# Patient Record
Sex: Female | Born: 1940 | Race: Black or African American | Hispanic: No | Marital: Married | Smoking: Never smoker
Health system: Southern US, Community
[De-identification: ages and names within clinical notes are randomized; demographics above are authoritative.]

## PROBLEM LIST (undated history)

## (undated) DIAGNOSIS — I10 Essential (primary) hypertension: Secondary | ICD-10-CM

## (undated) DIAGNOSIS — E039 Hypothyroidism, unspecified: Secondary | ICD-10-CM

## (undated) HISTORY — PX: PARTIAL HYSTERECTOMY: SHX80

## (undated) HISTORY — DX: Hypothyroidism, unspecified: E03.9

## (undated) HISTORY — DX: Essential (primary) hypertension: I10

---

## 1997-12-23 ENCOUNTER — Encounter: Payer: Self-pay | Admitting: Surgery

## 1997-12-24 ENCOUNTER — Ambulatory Visit (HOSPITAL_COMMUNITY): Admission: RE | Admit: 1997-12-24 | Discharge: 1997-12-25 | Payer: Self-pay | Admitting: Surgery

## 2013-03-27 ENCOUNTER — Ambulatory Visit: Payer: Self-pay | Admitting: Cardiology

## 2014-03-13 ENCOUNTER — Encounter: Payer: Self-pay | Admitting: Cardiology

## 2014-03-25 ENCOUNTER — Encounter: Payer: Self-pay | Admitting: *Deleted

## 2014-03-26 ENCOUNTER — Encounter: Payer: Self-pay | Admitting: *Deleted

## 2014-03-26 ENCOUNTER — Ambulatory Visit (INDEPENDENT_AMBULATORY_CARE_PROVIDER_SITE_OTHER): Payer: Medicare Other | Admitting: Cardiology

## 2014-03-26 VITALS — BP 146/97 | HR 109 | Ht 67.0 in | Wt 185.0 lb

## 2014-03-26 DIAGNOSIS — I10 Essential (primary) hypertension: Secondary | ICD-10-CM

## 2014-03-26 DIAGNOSIS — R002 Palpitations: Secondary | ICD-10-CM

## 2014-03-26 DIAGNOSIS — R000 Tachycardia, unspecified: Secondary | ICD-10-CM

## 2014-03-26 NOTE — Patient Instructions (Signed)
Your physician recommends that you schedule a follow-up appointment in: 4 weeks with Dr. Wyline MoodBranch  Your physician recommends that you continue on your current medications as directed. Please refer to the Current Medication list given to you today.  Your physician has recommended that you wear an event monitor for 2 WEEKS. Event monitors are medical devices that record the heart's electrical activity. Doctors most often us these monitors to diagnose arrhythmias. Arrhythmias are problems with the speed or rhythm of the heartbeat. The monitor is a small, portable device. You can wear one while you do your normal daily activities. This is usually used to diagnose what is causing palpitations/syncope (passing out).  Thank you for choosing [facility]!!

## 2014-03-26 NOTE — Progress Notes (Signed)
Clinical Summary [name] is a 74 y.o.female seen today as a new patient for the following medical problems.  1. Palpitations/Tachycardia - notes occasional palpations at times. Occurs often with feeling excited. Does not occur frequently. No other associated symptoms. Resolves with rest. - walks a mile daily without troubles or symptoms. - occas LE edema. No orthopnea. No chest pain  - stopped coffee 2 weeks ago, drinks tea occasionally, no sodas, no energy drinks, no EtoH. 4-5 bottles of water a day.      Past Medical History  Diagnosis Date  . Hypertension   . Hypothyroidism      Allergies  Allergen Reactions  . Benicar [Olmesartan] Other (See Comments)    LIGHTHEADEDNESS  . Carvedilol Other (See Comments)    DIZZINESS,FATIGUE     Current Outpatient Prescriptions  Medication Sig Dispense Refill  . amLODipine (NORVASC) 10 MG tablet Take 10 mg by mouth daily.    Marland Kitchen aspirin 81 MG tablet Take 81 mg by mouth daily.    Marland Kitchen atorvastatin (LIPITOR) 10 MG tablet Take 10 mg by mouth daily.    . benazepril (LOTENSIN) 40 MG tablet Take 40 mg by mouth daily.    . cholecalciferol (VITAMIN D) 1000 UNITS tablet Take 1,000 Units by mouth daily.    . Omega-3 Fatty Acids (FISH OIL) 1000 MG CAPS Take 1,000 mg by mouth.     No current facility-administered medications for this visit.     Past Surgical History  Procedure Laterality Date  . Partial hysterectomy       Allergies  Allergen Reactions  . Benicar [Olmesartan] Other (See Comments)    LIGHTHEADEDNESS  . Carvedilol Other (See Comments)    DIZZINESS,FATIGUE      Family History  Problem Relation Age of Onset  . Diabetes Mother   . Hypotension Father      Social History Claudia Porter reports that she has never smoked. She does not have any smokeless tobacco history on file. Claudia Porter has no alcohol history on file.   Review of Systems CONSTITUTIONAL: No weight loss, fever, chills, weakness or fatigue.    HEENT: Eyes: No visual loss, blurred vision, double vision or yellow sclerae.No hearing loss, sneezing, congestion, runny nose or sore throat.  SKIN: No rash or itching.  CARDIOVASCULAR: per HPI RESPIRATORY: No shortness of breath, cough or sputum.  GASTROINTESTINAL: No anorexia, nausea, vomiting or diarrhea. No abdominal pain or blood.  GENITOURINARY: No burning on urination, no polyuria NEUROLOGICAL: No headache, dizziness, syncope, paralysis, ataxia, numbness or tingling in the extremities. No change in bowel or bladder control.  MUSCULOSKELETAL: No muscle, back pain, joint pain or stiffness.  LYMPHATICS: No enlarged nodes. No history of splenectomy.  PSYCHIATRIC: No history of depression or anxiety.  ENDOCRINOLOGIC: No reports of sweating, cold or heat intolerance. No polyuria or polydipsia.  Marland Kitchen   Physical Examination p 104 bp 146/97 Wt 185 lbs BMI 29 O2 sat 96% Gen: resting comfortably, no acute distress HEENT: no scleral icterus, pupils equal round and reactive, no palptable cervical adenopathy,  CV: RRR, no m/r/g, no JVD Resp: Clear to auscultation bilaterally GI: abdomen is soft, non-tender, non-distended, normal bowel sounds, no hepatosplenomegaly MSK: extremities are warm, no edema.  Skin: warm, no rash Neuro:  no focal deficits Psych: appropriate affect   Diagnostic Studies 02/2013 Echo LVEF 60-65%, abnormal diastolic function,    Assessment and Plan   1. Palpitations/Tachycardia - resting sinus tach on EKG. No hypoxia, no chronic pain,no anemia,  normal thyroid, negative orthostatics don't support dehydration. Limited caffeine intake, no other stimulants. Normal echo.  - will obtain 2 week event monitor.      Antoine PocheJonathan F. Makiya Jeune, M.D.

## 2014-03-27 DIAGNOSIS — R002 Palpitations: Secondary | ICD-10-CM | POA: Insufficient documentation

## 2014-03-28 DIAGNOSIS — R002 Palpitations: Secondary | ICD-10-CM | POA: Diagnosis not present

## 2014-04-16 ENCOUNTER — Telehealth: Payer: Self-pay | Admitting: *Deleted

## 2014-04-16 NOTE — Telephone Encounter (Signed)
EOS event monitor received. To scan and route to Dr. Wyline MoodBranch

## 2014-04-24 ENCOUNTER — Ambulatory Visit (INDEPENDENT_AMBULATORY_CARE_PROVIDER_SITE_OTHER): Payer: Medicare Other | Admitting: Cardiology

## 2014-04-24 ENCOUNTER — Other Ambulatory Visit: Payer: Self-pay | Admitting: *Deleted

## 2014-04-24 ENCOUNTER — Encounter: Payer: Self-pay | Admitting: Cardiology

## 2014-04-24 VITALS — BP 151/94 | HR 101 | Ht 67.0 in | Wt 184.0 lb

## 2014-04-24 DIAGNOSIS — R000 Tachycardia, unspecified: Secondary | ICD-10-CM

## 2014-04-24 DIAGNOSIS — R002 Palpitations: Secondary | ICD-10-CM

## 2014-04-24 NOTE — Patient Instructions (Signed)
Your physician recommends that you schedule a follow-up appointment in: as needed with Dr. Wyline MoodBranch  Your physician recommends that you continue on your current medications as directed. Please refer to the Current Medication list given to you today.  Thank you for choosing [facility]!!

## 2014-04-24 NOTE — Progress Notes (Signed)
     Clinical Summary Ms. Nanna is a 74 y.o.female seen today for follow up of the following medical problems.   1. Palpitations/Tachycardia - since last visit symptoms much improved after cutting back on caffeine - event monitor showed no significant arrhythmias.    Past Medical History  Diagnosis Date  . Hypertension   . Hypothyroidism      Allergies  Allergen Reactions  . Benicar [Olmesartan] Other (See Comments)    LIGHTHEADEDNESS  . Carvedilol Other (See Comments)    DIZZINESS,FATIGUE     Current Outpatient Prescriptions  Medication Sig Dispense Refill  . amLODipine (NORVASC) 10 MG tablet Take 10 mg by mouth daily.    Marland Kitchen. aspirin 81 MG tablet Take 81 mg by mouth daily.    Marland Kitchen. atorvastatin (LIPITOR) 10 MG tablet Take 10 mg by mouth daily.    . benazepril (LOTENSIN) 40 MG tablet Take 40 mg by mouth daily.    . cholecalciferol (VITAMIN D) 1000 UNITS tablet Take 1,000 Units by mouth daily.    . Coenzyme Q10 (COQ10) 100 MG CAPS Take 1 capsule by mouth daily.    . Omega-3 Fatty Acids (FISH OIL) 1000 MG CAPS Take 2,000 mg by mouth daily.      No current facility-administered medications for this visit.     Past Surgical History  Procedure Laterality Date  . Partial hysterectomy       Allergies  Allergen Reactions  . Benicar [Olmesartan] Other (See Comments)    LIGHTHEADEDNESS  . Carvedilol Other (See Comments)    DIZZINESS,FATIGUE      Family History  Problem Relation Age of Onset  . Diabetes Mother   . Hypotension Father      Social History Ms. Veracruz reports that she has never smoked. She has never used smokeless tobacco. Ms. Holly Bodilyrtis has no alcohol history on file.   Review of Systems CONSTITUTIONAL: No weight loss, fever, chills, weakness or fatigue.  HEENT: Eyes: No visual loss, blurred vision, double vision or yellow sclerae.No hearing loss, sneezing, congestion, runny nose or sore throat.  SKIN: No rash or itching.  CARDIOVASCULAR: per  HPI RESPIRATORY: No shortness of breath, cough or sputum.  GASTROINTESTINAL: No anorexia, nausea, vomiting or diarrhea. No abdominal pain or blood.  GENITOURINARY: No burning on urination, no polyuria NEUROLOGICAL: No headache, dizziness, syncope, paralysis, ataxia, numbness or tingling in the extremities. No change in bowel or bladder control.  MUSCULOSKELETAL: No muscle, back pain, joint pain or stiffness.  LYMPHATICS: No enlarged nodes. No history of splenectomy.  PSYCHIATRIC: No history of depression or anxiety.  ENDOCRINOLOGIC: No reports of sweating, cold or heat intolerance. No polyuria or polydipsia.  Marland Kitchen.   Physical Examination p 101 bp 151/94 Wt 184 lbs BMI 29 Gen: resting comfortably, no acute distress HEENT: no scleral icterus, pupils equal round and reactive, no palptable cervical adenopathy,  CV: RRR, no m/r/g, no JVD Resp: Clear to auscultation bilaterally GI: abdomen is soft, non-tender, non-distended, normal bowel sounds, no hepatosplenomegaly MSK: extremities are warm, no edema.  Skin: warm, no rash Neuro:  no focal deficits Psych: appropriate affect   Diagnostic Studies 02/2013 Echo LVEF 60-65%, abnormal diastolic function,   03/2014 Event monitor No arrhythmias  Assessment and Plan  1. Palpitations/Tachycardia - symptoms improved with decreased caffeine intake. No signifcant arrhythias on monitor. - no further cardiac work up at this time    F/u as needed  Antoine PocheJonathan F. Branch, M.D.

## 2016-04-09 DIAGNOSIS — R000 Tachycardia, unspecified: Secondary | ICD-10-CM | POA: Diagnosis not present

## 2016-04-09 DIAGNOSIS — I10 Essential (primary) hypertension: Secondary | ICD-10-CM | POA: Diagnosis not present

## 2016-04-09 DIAGNOSIS — Z6826 Body mass index (BMI) 26.0-26.9, adult: Secondary | ICD-10-CM | POA: Diagnosis not present

## 2016-04-09 DIAGNOSIS — E785 Hyperlipidemia, unspecified: Secondary | ICD-10-CM | POA: Diagnosis not present

## 2016-04-09 DIAGNOSIS — Z299 Encounter for prophylactic measures, unspecified: Secondary | ICD-10-CM | POA: Diagnosis not present

## 2016-04-09 DIAGNOSIS — Z789 Other specified health status: Secondary | ICD-10-CM | POA: Diagnosis not present

## 2016-04-09 DIAGNOSIS — Z713 Dietary counseling and surveillance: Secondary | ICD-10-CM | POA: Diagnosis not present

## 2016-06-23 DIAGNOSIS — Z79899 Other long term (current) drug therapy: Secondary | ICD-10-CM | POA: Diagnosis not present

## 2016-06-23 DIAGNOSIS — Z0000 Encounter for general adult medical examination without abnormal findings: Secondary | ICD-10-CM | POA: Diagnosis not present

## 2016-06-23 DIAGNOSIS — E039 Hypothyroidism, unspecified: Secondary | ICD-10-CM | POA: Diagnosis not present

## 2016-06-23 DIAGNOSIS — Z90711 Acquired absence of uterus with remaining cervical stump: Secondary | ICD-10-CM | POA: Diagnosis not present

## 2016-06-23 DIAGNOSIS — M810 Age-related osteoporosis without current pathological fracture: Secondary | ICD-10-CM | POA: Diagnosis not present

## 2016-06-23 DIAGNOSIS — Z6825 Body mass index (BMI) 25.0-25.9, adult: Secondary | ICD-10-CM | POA: Diagnosis not present

## 2016-06-23 DIAGNOSIS — Z299 Encounter for prophylactic measures, unspecified: Secondary | ICD-10-CM | POA: Diagnosis not present

## 2016-06-23 DIAGNOSIS — Z1389 Encounter for screening for other disorder: Secondary | ICD-10-CM | POA: Diagnosis not present

## 2016-06-23 DIAGNOSIS — E785 Hyperlipidemia, unspecified: Secondary | ICD-10-CM | POA: Diagnosis not present

## 2016-06-23 DIAGNOSIS — R5383 Other fatigue: Secondary | ICD-10-CM | POA: Diagnosis not present

## 2016-06-23 DIAGNOSIS — Z7189 Other specified counseling: Secondary | ICD-10-CM | POA: Diagnosis not present

## 2016-06-23 DIAGNOSIS — I10 Essential (primary) hypertension: Secondary | ICD-10-CM | POA: Diagnosis not present

## 2016-07-05 DIAGNOSIS — R5383 Other fatigue: Secondary | ICD-10-CM | POA: Diagnosis not present

## 2016-07-05 DIAGNOSIS — E039 Hypothyroidism, unspecified: Secondary | ICD-10-CM | POA: Diagnosis not present

## 2016-07-05 DIAGNOSIS — Z79899 Other long term (current) drug therapy: Secondary | ICD-10-CM | POA: Diagnosis not present

## 2016-07-05 DIAGNOSIS — E785 Hyperlipidemia, unspecified: Secondary | ICD-10-CM | POA: Diagnosis not present

## 2016-07-20 DIAGNOSIS — M810 Age-related osteoporosis without current pathological fracture: Secondary | ICD-10-CM | POA: Diagnosis not present

## 2016-09-27 DIAGNOSIS — E039 Hypothyroidism, unspecified: Secondary | ICD-10-CM | POA: Diagnosis not present

## 2016-09-27 DIAGNOSIS — I10 Essential (primary) hypertension: Secondary | ICD-10-CM | POA: Diagnosis not present

## 2016-09-27 DIAGNOSIS — E785 Hyperlipidemia, unspecified: Secondary | ICD-10-CM | POA: Diagnosis not present

## 2016-09-27 DIAGNOSIS — Z299 Encounter for prophylactic measures, unspecified: Secondary | ICD-10-CM | POA: Diagnosis not present

## 2016-09-27 DIAGNOSIS — M810 Age-related osteoporosis without current pathological fracture: Secondary | ICD-10-CM | POA: Diagnosis not present

## 2016-09-27 DIAGNOSIS — Z6825 Body mass index (BMI) 25.0-25.9, adult: Secondary | ICD-10-CM | POA: Diagnosis not present

## 2016-12-21 ENCOUNTER — Emergency Department (HOSPITAL_COMMUNITY)
Admission: EM | Admit: 2016-12-21 | Discharge: 2016-12-21 | Disposition: A | Discharge status: Home / Self Care | Payer: PPO | Attending: Physician Assistant | Admitting: Physician Assistant

## 2016-12-21 ENCOUNTER — Emergency Department (HOSPITAL_COMMUNITY): Payer: PPO

## 2016-12-21 ENCOUNTER — Encounter (HOSPITAL_COMMUNITY): Payer: Self-pay

## 2016-12-21 DIAGNOSIS — Z7982 Long term (current) use of aspirin: Secondary | ICD-10-CM | POA: Diagnosis not present

## 2016-12-21 DIAGNOSIS — R55 Syncope and collapse: Secondary | ICD-10-CM | POA: Insufficient documentation

## 2016-12-21 DIAGNOSIS — I10 Essential (primary) hypertension: Secondary | ICD-10-CM | POA: Insufficient documentation

## 2016-12-21 DIAGNOSIS — E039 Hypothyroidism, unspecified: Secondary | ICD-10-CM | POA: Insufficient documentation

## 2016-12-21 DIAGNOSIS — R404 Transient alteration of awareness: Secondary | ICD-10-CM | POA: Diagnosis not present

## 2016-12-21 DIAGNOSIS — Z79899 Other long term (current) drug therapy: Secondary | ICD-10-CM | POA: Diagnosis not present

## 2016-12-21 LAB — CBC WITH DIFFERENTIAL/PLATELET
Basophils Absolute: 0.1 10*3/uL (ref 0.0–0.1)
Basophils Relative: 1 %
Eosinophils Absolute: 0 10*3/uL (ref 0.0–0.7)
Eosinophils Relative: 1 %
HCT: 34.8 % — ABNORMAL LOW (ref 36.0–46.0)
HEMOGLOBIN: 12.2 g/dL (ref 12.0–15.0)
LYMPHS PCT: 31 %
Lymphs Abs: 2.5 10*3/uL (ref 0.7–4.0)
MCH: 29.7 pg (ref 26.0–34.0)
MCHC: 35.1 g/dL (ref 30.0–36.0)
MCV: 84.7 fL (ref 78.0–100.0)
MONO ABS: 0.5 10*3/uL (ref 0.1–1.0)
MONOS PCT: 6 %
NEUTROS ABS: 5 10*3/uL (ref 1.7–7.7)
NEUTROS PCT: 61 %
Platelets: 240 10*3/uL (ref 150–400)
RBC: 4.11 MIL/uL (ref 3.87–5.11)
RDW: 13.8 % (ref 11.5–15.5)
WBC: 8.1 10*3/uL (ref 4.0–10.5)

## 2016-12-21 LAB — I-STAT TROPONIN, ED
TROPONIN I, POC: 0 ng/mL (ref 0.00–0.08)
Troponin i, poc: 0 ng/mL (ref 0.00–0.08)

## 2016-12-21 LAB — URINALYSIS, ROUTINE W REFLEX MICROSCOPIC
Bilirubin Urine: NEGATIVE
Glucose, UA: NEGATIVE mg/dL
Hgb urine dipstick: NEGATIVE
KETONES UR: NEGATIVE mg/dL
LEUKOCYTES UA: NEGATIVE
NITRITE: NEGATIVE
PH: 7 (ref 5.0–8.0)
PROTEIN: NEGATIVE mg/dL
Specific Gravity, Urine: 1.004 — ABNORMAL LOW (ref 1.005–1.030)

## 2016-12-21 LAB — COMPREHENSIVE METABOLIC PANEL
ALBUMIN: 3.1 g/dL — AB (ref 3.5–5.0)
ALK PHOS: 47 U/L (ref 38–126)
ALT: 8 U/L — ABNORMAL LOW (ref 14–54)
ANION GAP: 7 (ref 5–15)
AST: 14 U/L — ABNORMAL LOW (ref 15–41)
BUN: 10 mg/dL (ref 6–20)
CHLORIDE: 110 mmol/L (ref 101–111)
CO2: 22 mmol/L (ref 22–32)
Calcium: 7.9 mg/dL — ABNORMAL LOW (ref 8.9–10.3)
Creatinine, Ser: 0.8 mg/dL (ref 0.44–1.00)
GFR calc Af Amer: >60 mL/min (ref >60)
GFR calc non Af Amer: >60 mL/min (ref >60)
GLUCOSE: 133 mg/dL — AB (ref 65–99)
POTASSIUM: 3 mmol/L — AB (ref 3.5–5.1)
SODIUM: 139 mmol/L (ref 135–145)
Total Bilirubin: 0.5 mg/dL (ref 0.3–1.2)
Total Protein: 5.7 g/dL — ABNORMAL LOW (ref 6.5–8.1)

## 2016-12-21 LAB — AMMONIA: Ammonia: 16 umol/L (ref 9–35)

## 2016-12-21 LAB — I-STAT CG4 LACTIC ACID, ED: LACTIC ACID, VENOUS: 1.5 mmol/L (ref 0.5–1.9)

## 2016-12-21 NOTE — ED Triage Notes (Addendum)
Pt arrives via EMS as code stemi with [facility]. PT was at home doing yard work with husband at 1415 when she became "faint". Sat down in chair and had brief syncopal episode. Vomited x 1 and incontinent of urine. Pt denied any chest pain. Reports generalized weakness. A&Ox .  #18 LAC.  324mg  ASA pta. Cards at bedside and STEMI canceled

## 2016-12-21 NOTE — ED Notes (Signed)
Pt not able to pee right now, pt has a external cath on and pt was informed that we need urine.

## 2016-12-21 NOTE — Progress Notes (Signed)
   BRIEF INTERVENTIONAL CARDIOLOGY NOTE  Claudia Porter is a pleasant 76 year old woman who was evaluated by Dr. Wyline MoodBranch back in March 2016 for tachycardia/palpitations.  Otherwise has hypertension hypothyroidism.  Palpitations significantly reduced after cutting back her caffeine.  She was brought in by EMS as a possible code STEMI.  Her EKG does show subtle borderline ST elevations in II, 3 and aVF however cannot exclude PR depression as opposed to ST elevation.  There are no recent changes, and only 1 or 2 leads look different than previous EKG from 2 years ago.  Currently, she is chest pain-free with no dyspnea.  She is no longer diaphoretic.  Clinically she does not appear to be having an ST elevation MI. Her presentation was weakness and syncopal episode associated with loss of bladder continence.  After her syncopal episode, she had an episode of nausea and is ultimately felt weak.  Upon EMS evaluation she was noted to be diaphoretic.  The EKG was checked and possible code STEMI was called.  We went to evaluate the person in the emergency room.  She is currently hemodynamically stable, with stable EKG as compared to initial EMS EKG.. I discussed her findings with the ER physician (Dr. Corlis LeakMackuen) --more concerning than her EKG is clinically that her affect seems to be quite inappropriate.  She seems to be repeating herself and is a bit confused.  Not overly convincing clinically for STEMI.  Therefore we decided to cancel code STEMI, and evaluate her altered mental status with head CT. We will await results of CT scan and troponin levels. While she may indeed end up with cardiac catheterization, I do not think the appropriate course of action is to take her urgently to the cardiac catheterization lab at this time.   ER evaluation ongoing.   Bryan Lemmaavid Harding, M.D., M.S. Interventional Cardiologist   Pager # [phone] Phone # [phone] [address] [place], [place] [address]

## 2016-12-21 NOTE — ED Provider Notes (Signed)
[facility] EMERGENCY DEPARTMENT Provider Note   CSN: [id number] Arrival date & time: 12/21/16  1525     History   Chief Complaint Chief Complaint  Patient presents with  . Loss of Consciousness    abnormal EKG    HPI Claudia Porter is a 76 y.o. female.  HPI   Patient is a 76 year old female brought in by [facility] E[facility].  Apparently patient has been was doing some yard work and she was outside with them and she all of a sudden felt faint.  She sat down in her chair and then syncopized.  Patient lost bladder continence.  And lost consciousness.  Patient's EKG appeared to be a STEMI and so she was called a STEMI by EMS on her way in.  Denies any recent symptoms however patient has a slightly odd affect, is smiling a lot and unable to give detailed answers.  Patient has no chest pain.  No shortness of breath.  No indigestion symptoms.  No weakness no dizziness.  Patient feels back to normal.   Past Medical History:  Diagnosis Date  . Hypertension   . Hypothyroidism     Patient Active Problem List   Diagnosis Date Noted  . Palpitations 03/27/2014    Past Surgical History:  Procedure Laterality Date  . PARTIAL HYSTERECTOMY      OB History    No data available       Home Medications    Prior to Admission medications   Medication Sig Start Date End Date Taking? Authorizing Provider  amLODipine (NORVASC) 10 MG tablet Take 10 mg by mouth daily.    [provider]  aspirin 81 MG tablet Take 81 mg by mouth daily.    [provider]  atorvastatin (LIPITOR) 10 MG tablet Take 10 mg by mouth daily.    [provider]  benazepril (LOTENSIN) 40 MG tablet Take 40 mg by mouth daily.    [provider]  cholecalciferol (VITAMIN D) 1000 UNITS tablet Take 1,000 Units by mouth daily.    [provider]  Coenzyme Q10 (COQ10) 100 MG CAPS Take 1 capsule by mouth daily.    [provider]  Omega-3 Fatty  Acids (FISH OIL) 1000 MG CAPS Take 2,000 mg by mouth daily.     [provider]    Family History Family History  Problem Relation Age of Onset  . Diabetes Mother   . Hypotension Father     Social History Social History  Substance Use Topics  . Smoking status: Never Smoker  . Smokeless tobacco: Never Used  . Alcohol use Not on file     Allergies   Benicar [olmesartan] and Carvedilol   Review of Systems Review of Systems  Constitutional: Negative for activity change, fatigue and fever.  Respiratory: Negative for shortness of breath.   Cardiovascular: Negative for chest pain.  Gastrointestinal: Negative for abdominal pain.  Neurological: Positive for syncope. Negative for weakness and light-headedness.     Physical Exam Updated Vital Signs BP 126/74 (BP Location: Left Arm)   Pulse 64   Temp (!) 97.3 F (36.3 C) (Oral)   Resp 13   Wt 83.5 kg (184 lb)   SpO2 100%   BMI 28.82 kg/m   Physical Exam  Constitutional: She is oriented to person, place, and time. She appears well-developed and well-nourished.  HENT:  Head: Normocephalic and atraumatic.  Eyes: Right eye exhibits no discharge. Left eye exhibits no discharge.  Cardiovascular: Normal  rate, regular rhythm and normal heart sounds.   No murmur heard. Pulmonary/Chest: Effort normal and breath sounds normal. She has no wheezes. She has no rales.  Abdominal: Soft. She exhibits no distension. There is no tenderness.  Neurological: She is oriented to person, place, and time. No cranial nerve deficit. Coordination normal.  Equal strength bilaterally upper and lower extremities negative pronator drift. Normal sensation bilaterally. Speech comprehensible, no slurring. Facial nerve tested and appears grossly normal. Alert and oriented 3.   Skin: Skin is warm and dry. She is not diaphoretic.  Psychiatric:  Smiling a lot  Nursing note and vitals reviewed.    ED Treatments / Results  Labs (all labs  ordered are listed, but only abnormal results are displayed) Labs Reviewed  COMPREHENSIVE METABOLIC PANEL  CBC WITH DIFFERENTIAL/PLATELET  URINALYSIS, ROUTINE W REFLEX MICROSCOPIC  AMMONIA  I-STAT TROPONIN, ED  I-STAT CG4 LACTIC ACID, ED    EKG  EKG Interpretation None       Radiology No results found.  Procedures Procedures (including critical care time)  Medications Ordered in ED Medications - No data to display   Initial Impression / Assessment and Plan / ED Course  I have reviewed the triage vital signs and the nursing notes.  Pertinent labs & imaging results that were available during my care of the patient were reviewed by me and considered in my medical decision making (see chart for details).    Patient is a 76 year old female [facility] [place] [facility]  Apparently patient has been was doing some yard work and she was outside with them and she all of a sudden felt faint.  She sat down in her chair and then syncopized.  Patient lost bladder continence.  And lost consciousness.  Patient's EKG appeared to be a STEMI and so she was called a STEMI by EMS on her way in.  Denies any recent symptoms however patient has a slightly odd affect, is smiling a lot and unable to give detailed answers.  Patient has no chest pain.  No shortness of breath.  No indigestion symptoms.  No weakness no dizziness.  Patient feels back to normal.  3:51 PM Called a code STEMI, however with no symptoms and a borderline EKG STEMI physician does not wish to pursue.  Will workup for syncope.  In talking with patient as well as her family.  It sounds that she did not eat anything today and then felt very hungry.  She told her husband she felt very dizzy and hungry they sat her down and then she passed out momentarily.    Patient and family are not concerned and they think it likely from her not eating.    We had patient centered discussion about admission versus discharge and the  risks and benefits of both.   Decision made to do delta troponin and then discharge..  Final Clinical Impressions(s) / ED Diagnoses   Final diagnoses:  None    New Prescriptions New Prescriptions   No medications on file     Abelino DerrickMackuen, Corayma Cashatt Lyn, MD 12/21/16 2054

## 2016-12-21 NOTE — ED Notes (Signed)
Pt given a cup of water 

## 2016-12-21 NOTE — ED Notes (Signed)
Stemi team at bedside

## 2016-12-21 NOTE — ED Notes (Signed)
Pt walked to the bathroom and gave urine and walked back to her room with assist.

## 2016-12-21 NOTE — ED Notes (Signed)
Patient transported to CT 

## 2016-12-21 NOTE — Discharge Instructions (Signed)
Please return immediately if you syncopize again, or have any chest pain, or other concerns.

## 2016-12-30 DIAGNOSIS — I10 Essential (primary) hypertension: Secondary | ICD-10-CM | POA: Diagnosis not present

## 2016-12-30 DIAGNOSIS — M810 Age-related osteoporosis without current pathological fracture: Secondary | ICD-10-CM | POA: Diagnosis not present

## 2016-12-30 DIAGNOSIS — Z6825 Body mass index (BMI) 25.0-25.9, adult: Secondary | ICD-10-CM | POA: Diagnosis not present

## 2016-12-30 DIAGNOSIS — Z5320 Procedure and treatment not carried out because of patient's decision for unspecified reasons: Secondary | ICD-10-CM | POA: Diagnosis not present

## 2016-12-30 DIAGNOSIS — Z299 Encounter for prophylactic measures, unspecified: Secondary | ICD-10-CM | POA: Diagnosis not present

## 2016-12-30 DIAGNOSIS — E785 Hyperlipidemia, unspecified: Secondary | ICD-10-CM | POA: Diagnosis not present

## 2017-04-14 DIAGNOSIS — I10 Essential (primary) hypertension: Secondary | ICD-10-CM | POA: Diagnosis not present

## 2017-04-14 DIAGNOSIS — Z299 Encounter for prophylactic measures, unspecified: Secondary | ICD-10-CM | POA: Diagnosis not present

## 2017-04-14 DIAGNOSIS — J069 Acute upper respiratory infection, unspecified: Secondary | ICD-10-CM | POA: Diagnosis not present

## 2017-04-14 DIAGNOSIS — Z87891 Personal history of nicotine dependence: Secondary | ICD-10-CM | POA: Diagnosis not present

## 2017-04-14 DIAGNOSIS — Z713 Dietary counseling and surveillance: Secondary | ICD-10-CM | POA: Diagnosis not present

## 2017-04-14 DIAGNOSIS — Z6824 Body mass index (BMI) 24.0-24.9, adult: Secondary | ICD-10-CM | POA: Diagnosis not present

## 2017-08-08 DIAGNOSIS — E7800 Pure hypercholesterolemia, unspecified: Secondary | ICD-10-CM | POA: Diagnosis not present

## 2017-08-08 DIAGNOSIS — Z0000 Encounter for general adult medical examination without abnormal findings: Secondary | ICD-10-CM | POA: Diagnosis not present

## 2017-08-08 DIAGNOSIS — Z7189 Other specified counseling: Secondary | ICD-10-CM | POA: Diagnosis not present

## 2017-08-08 DIAGNOSIS — Z1211 Encounter for screening for malignant neoplasm of colon: Secondary | ICD-10-CM | POA: Diagnosis not present

## 2017-08-08 DIAGNOSIS — Z299 Encounter for prophylactic measures, unspecified: Secondary | ICD-10-CM | POA: Diagnosis not present

## 2017-08-08 DIAGNOSIS — Z1331 Encounter for screening for depression: Secondary | ICD-10-CM | POA: Diagnosis not present

## 2017-08-08 DIAGNOSIS — Z6824 Body mass index (BMI) 24.0-24.9, adult: Secondary | ICD-10-CM | POA: Diagnosis not present

## 2017-08-08 DIAGNOSIS — Z1339 Encounter for screening examination for other mental health and behavioral disorders: Secondary | ICD-10-CM | POA: Diagnosis not present

## 2017-08-09 DIAGNOSIS — E7800 Pure hypercholesterolemia, unspecified: Secondary | ICD-10-CM | POA: Diagnosis not present

## 2017-08-09 DIAGNOSIS — Z79899 Other long term (current) drug therapy: Secondary | ICD-10-CM | POA: Diagnosis not present

## 2017-11-22 DIAGNOSIS — I10 Essential (primary) hypertension: Secondary | ICD-10-CM | POA: Diagnosis not present

## 2017-11-22 DIAGNOSIS — Z713 Dietary counseling and surveillance: Secondary | ICD-10-CM | POA: Diagnosis not present

## 2017-11-22 DIAGNOSIS — Z2821 Immunization not carried out because of patient refusal: Secondary | ICD-10-CM | POA: Diagnosis not present

## 2017-11-22 DIAGNOSIS — Z299 Encounter for prophylactic measures, unspecified: Secondary | ICD-10-CM | POA: Diagnosis not present

## 2017-11-22 DIAGNOSIS — Z6823 Body mass index (BMI) 23.0-23.9, adult: Secondary | ICD-10-CM | POA: Diagnosis not present

## 2018-11-24 DIAGNOSIS — Z6824 Body mass index (BMI) 24.0-24.9, adult: Secondary | ICD-10-CM | POA: Diagnosis not present

## 2018-11-24 DIAGNOSIS — G309 Alzheimer's disease, unspecified: Secondary | ICD-10-CM | POA: Diagnosis not present

## 2018-11-24 DIAGNOSIS — I10 Essential (primary) hypertension: Secondary | ICD-10-CM | POA: Diagnosis not present

## 2018-11-24 DIAGNOSIS — F0280 Dementia in other diseases classified elsewhere without behavioral disturbance: Secondary | ICD-10-CM | POA: Diagnosis not present

## 2018-11-24 DIAGNOSIS — G4700 Insomnia, unspecified: Secondary | ICD-10-CM | POA: Diagnosis not present

## 2018-11-24 DIAGNOSIS — Z299 Encounter for prophylactic measures, unspecified: Secondary | ICD-10-CM | POA: Diagnosis not present

## 2018-11-24 DIAGNOSIS — Z789 Other specified health status: Secondary | ICD-10-CM | POA: Diagnosis not present

## 2019-03-15 IMAGING — CT CT HEAD W/O CM
3 of 4 series · 16 of 47 positions shown, 19 images · non-contrast
Comparison: None.

CLINICAL DATA: History of syncopal episode.  Nausea.

EXAM:
CT HEAD WITHOUT CONTRAST
TECHNIQUE: Contiguous axial images were obtained from the base of the skull
through the vertex without intravenous contrast.

[Series 4: head 2.0 h70h · axial · 0.40mm/px · z∈[-95,+49]mm · 10 of 80 slices shown, 13 images]
[im 4/80  brain]
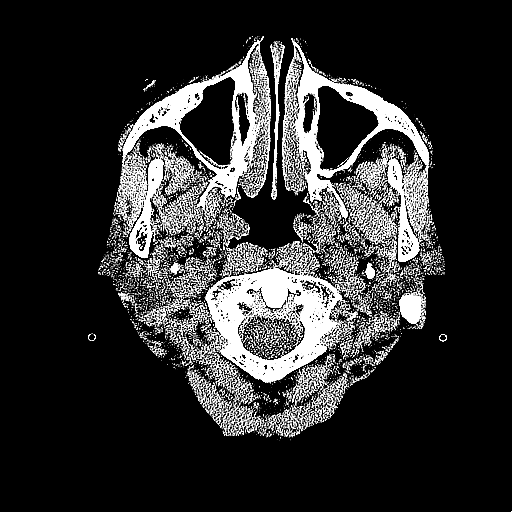
[im 4/80  bone]
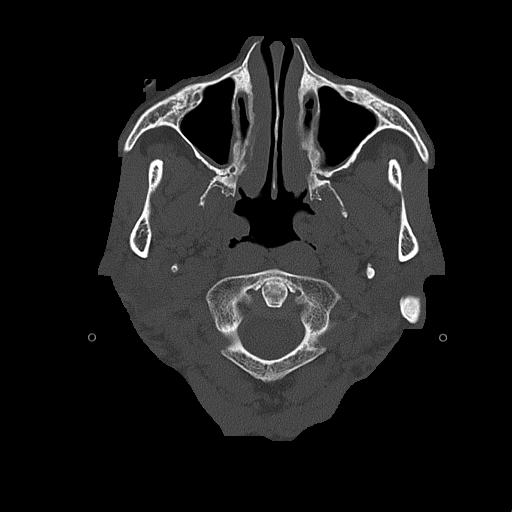
[im 12/80  brain]
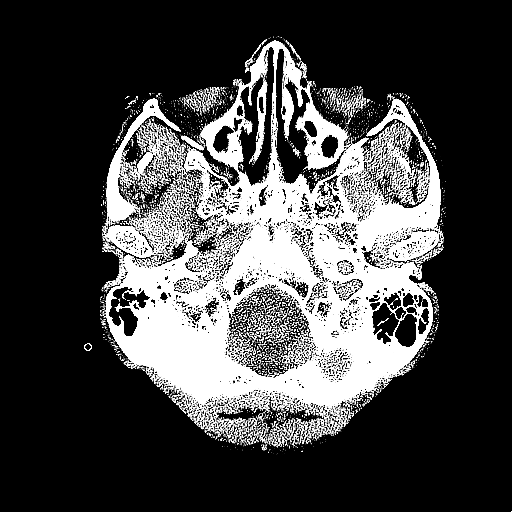
[im 20/80  brain]
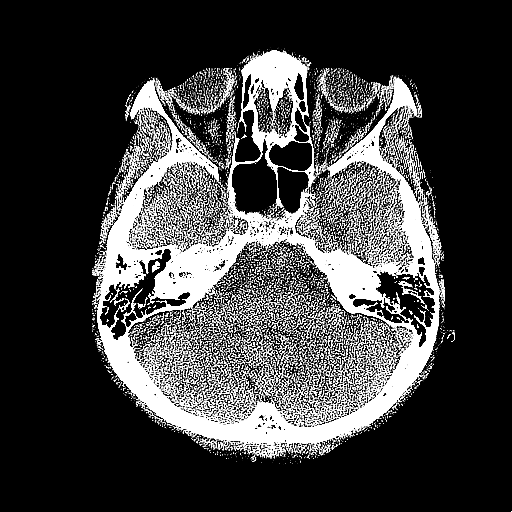
[im 28/80  brain]
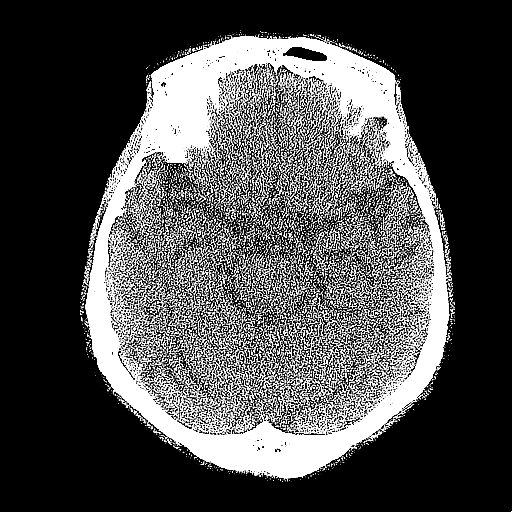
[im 36/80  brain]
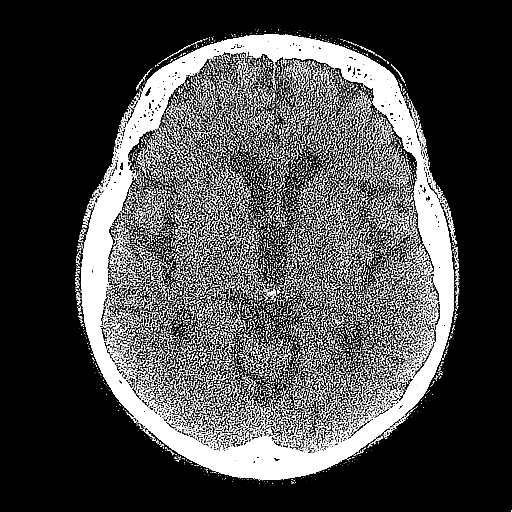
[im 36/80  bone]
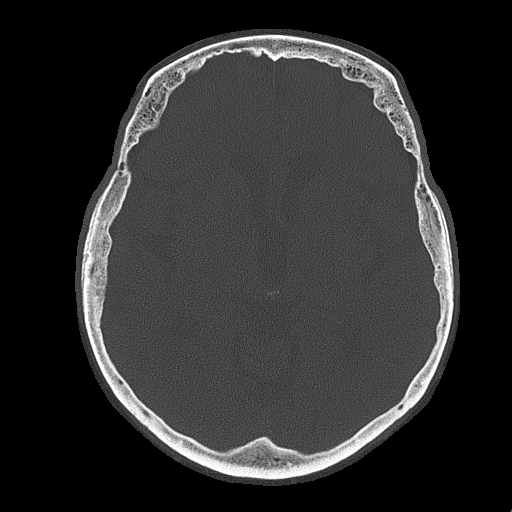
[im 44/80  brain]
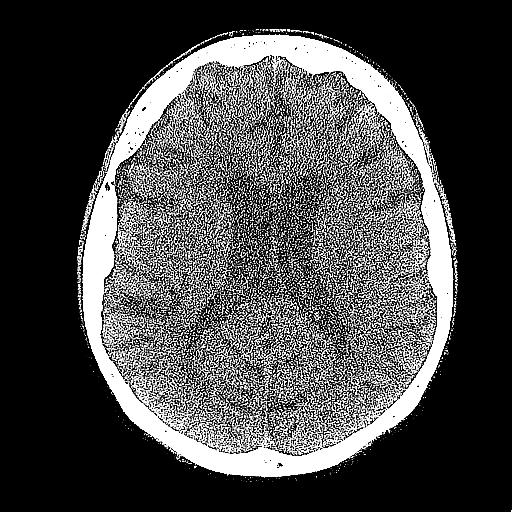
[im 52/80  brain]
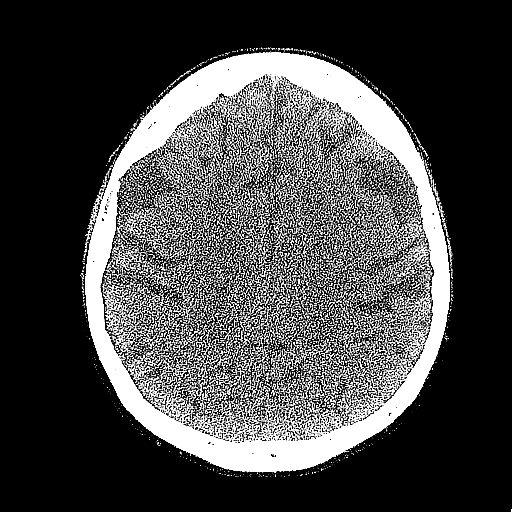
[im 60/80  brain]
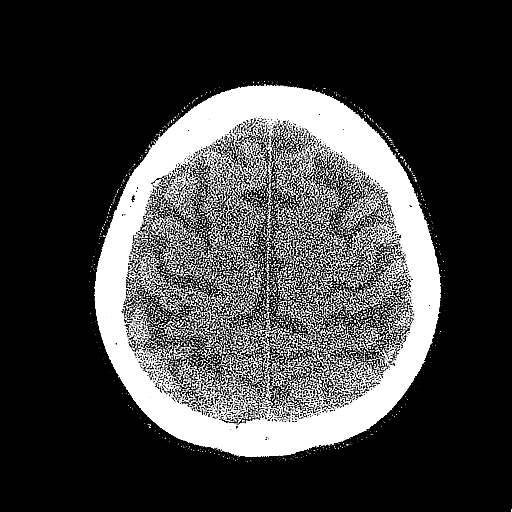
[im 68/80  brain]
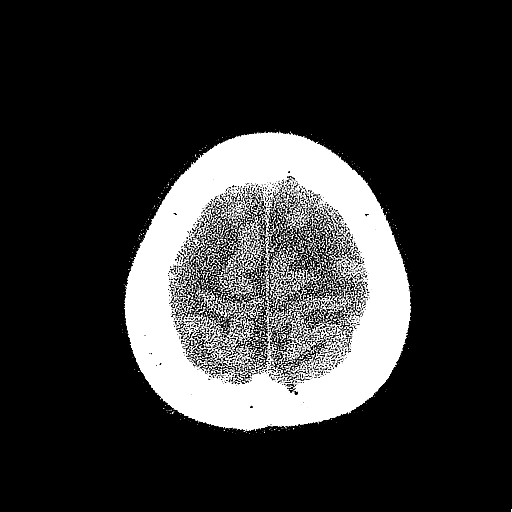
[im 68/80  bone]
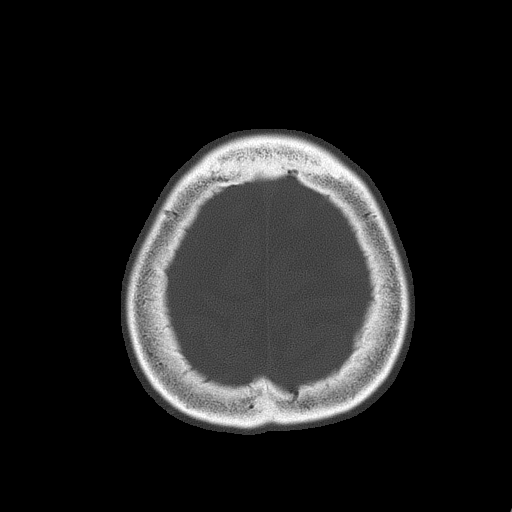
[im 76/80  brain]
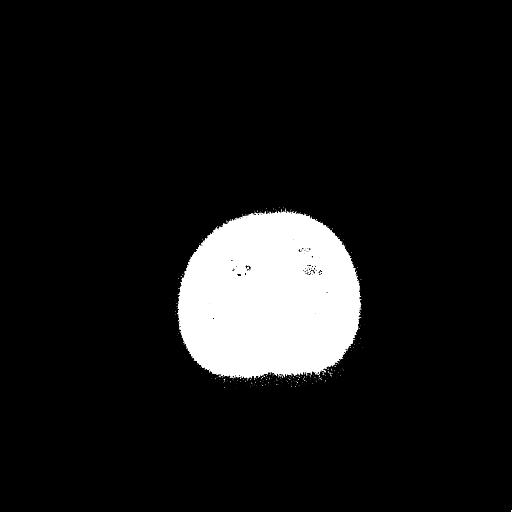

[Series 5: head 3.0 mpr cor · coronal · 0.32mm/px · 3 of 67 slices shown]
[im 23/67  brain]
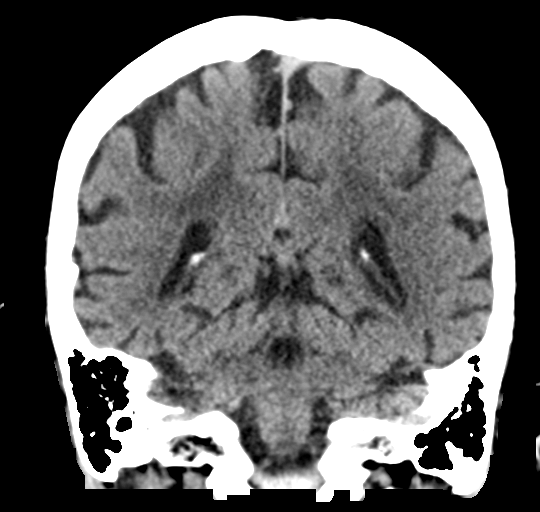
[im 30/67  brain]
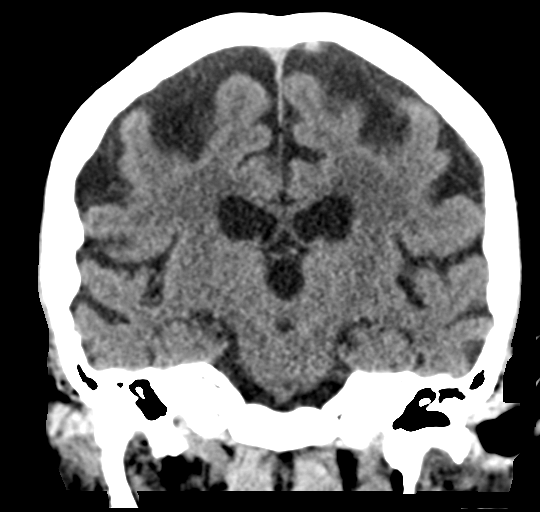
[im 37/67  brain]
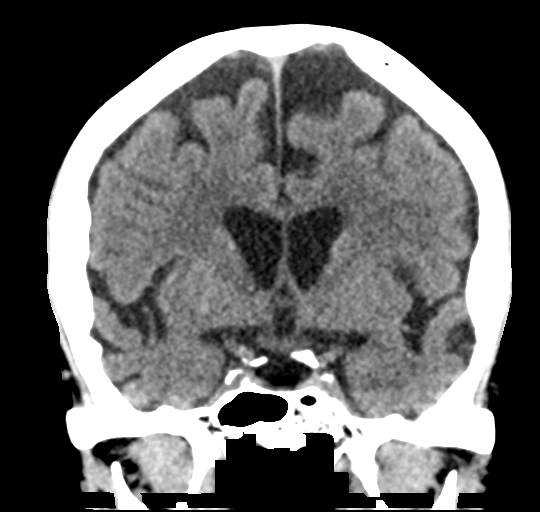

[Series 6: head 3.0 mpr sag · sagittal · 0.33mm/px · 3 of 54 slices shown]
[im 18/54  brain]
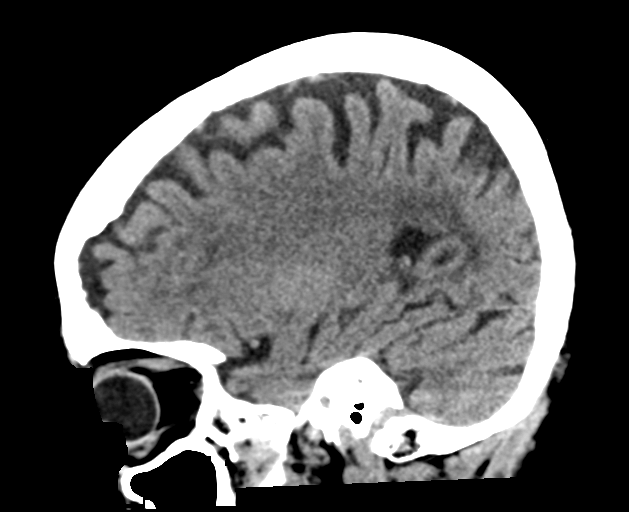
[im 27/54  brain]
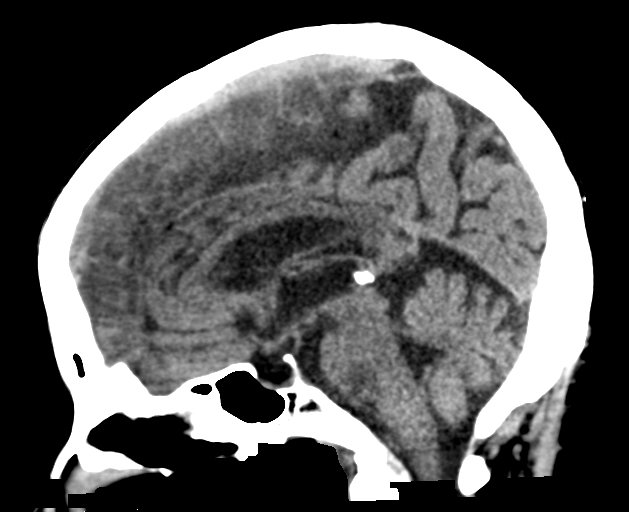
[im 36/54  brain]
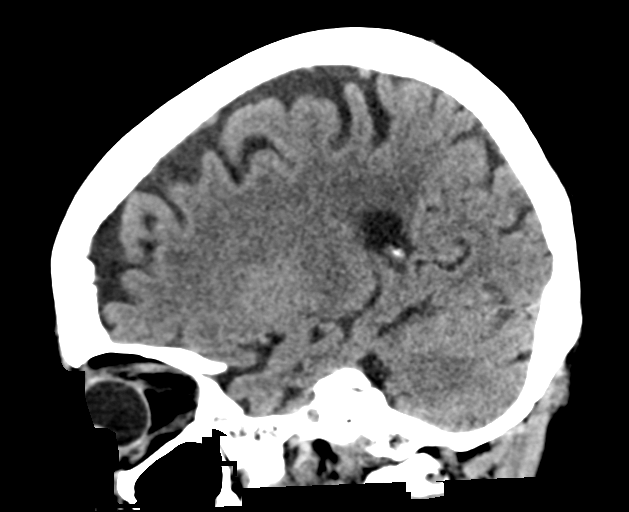

[16 of 47 positions shown; findings below may reference images not displayed]

FINDINGS: Brain: No evidence of acute infarction, hemorrhage, hydrocephalus,
extra-axial collection or mass lesion/mass effect. Moderate brain
parenchymal volume loss and periventricular microangiopathy.

Vascular: Calcific atherosclerotic disease at the skullbase.

Skull: Normal. Negative for fracture or focal lesion.

Sinuses/Orbits: Mild polypoid mucosal thickening of the maxillary
sinuses.

Other: None.
IMPRESSION: No acute intracranial abnormality.

Atrophy, chronic microvascular disease.

## 2019-07-13 DIAGNOSIS — F0280 Dementia in other diseases classified elsewhere without behavioral disturbance: Secondary | ICD-10-CM | POA: Diagnosis not present

## 2019-07-13 DIAGNOSIS — Z299 Encounter for prophylactic measures, unspecified: Secondary | ICD-10-CM | POA: Diagnosis not present

## 2019-07-13 DIAGNOSIS — E785 Hyperlipidemia, unspecified: Secondary | ICD-10-CM | POA: Diagnosis not present

## 2019-07-13 DIAGNOSIS — I10 Essential (primary) hypertension: Secondary | ICD-10-CM | POA: Diagnosis not present

## 2019-07-13 DIAGNOSIS — G309 Alzheimer's disease, unspecified: Secondary | ICD-10-CM | POA: Diagnosis not present

## 2019-08-08 DIAGNOSIS — Z789 Other specified health status: Secondary | ICD-10-CM | POA: Diagnosis not present

## 2019-08-08 DIAGNOSIS — G309 Alzheimer's disease, unspecified: Secondary | ICD-10-CM | POA: Diagnosis not present

## 2019-08-08 DIAGNOSIS — I10 Essential (primary) hypertension: Secondary | ICD-10-CM | POA: Diagnosis not present

## 2019-08-08 DIAGNOSIS — F0280 Dementia in other diseases classified elsewhere without behavioral disturbance: Secondary | ICD-10-CM | POA: Diagnosis not present

## 2019-08-08 DIAGNOSIS — Z299 Encounter for prophylactic measures, unspecified: Secondary | ICD-10-CM | POA: Diagnosis not present

## 2020-01-31 DIAGNOSIS — Z23 Encounter for immunization: Secondary | ICD-10-CM | POA: Diagnosis not present

## 2020-01-31 DIAGNOSIS — F0390 Unspecified dementia without behavioral disturbance: Secondary | ICD-10-CM | POA: Diagnosis not present

## 2020-01-31 DIAGNOSIS — E869 Volume depletion, unspecified: Secondary | ICD-10-CM | POA: Diagnosis not present

## 2020-01-31 DIAGNOSIS — R4020 Unspecified coma: Secondary | ICD-10-CM | POA: Diagnosis not present

## 2020-01-31 DIAGNOSIS — R4182 Altered mental status, unspecified: Secondary | ICD-10-CM | POA: Diagnosis not present

## 2020-01-31 DIAGNOSIS — I10 Essential (primary) hypertension: Secondary | ICD-10-CM | POA: Diagnosis not present

## 2020-01-31 DIAGNOSIS — F0280 Dementia in other diseases classified elsewhere without behavioral disturbance: Secondary | ICD-10-CM | POA: Diagnosis not present

## 2020-01-31 DIAGNOSIS — Z20822 Contact with and (suspected) exposure to covid-19: Secondary | ICD-10-CM | POA: Diagnosis not present

## 2020-01-31 DIAGNOSIS — R404 Transient alteration of awareness: Secondary | ICD-10-CM | POA: Diagnosis not present

## 2020-01-31 DIAGNOSIS — G459 Transient cerebral ischemic attack, unspecified: Secondary | ICD-10-CM | POA: Diagnosis not present

## 2020-01-31 DIAGNOSIS — Z87891 Personal history of nicotine dependence: Secondary | ICD-10-CM | POA: Diagnosis not present

## 2020-01-31 DIAGNOSIS — G309 Alzheimer's disease, unspecified: Secondary | ICD-10-CM | POA: Diagnosis not present

## 2020-01-31 DIAGNOSIS — E7800 Pure hypercholesterolemia, unspecified: Secondary | ICD-10-CM | POA: Diagnosis not present

## 2020-01-31 DIAGNOSIS — R55 Syncope and collapse: Secondary | ICD-10-CM | POA: Diagnosis not present

## 2020-01-31 DIAGNOSIS — E039 Hypothyroidism, unspecified: Secondary | ICD-10-CM | POA: Diagnosis not present

## 2020-01-31 DIAGNOSIS — J811 Chronic pulmonary edema: Secondary | ICD-10-CM | POA: Diagnosis not present

## 2020-01-31 DIAGNOSIS — R1310 Dysphagia, unspecified: Secondary | ICD-10-CM | POA: Diagnosis not present

## 2020-01-31 DIAGNOSIS — J9811 Atelectasis: Secondary | ICD-10-CM | POA: Diagnosis not present

## 2020-01-31 DIAGNOSIS — M1990 Unspecified osteoarthritis, unspecified site: Secondary | ICD-10-CM | POA: Diagnosis not present

## 2020-02-14 DIAGNOSIS — R55 Syncope and collapse: Secondary | ICD-10-CM | POA: Diagnosis not present

## 2020-02-14 DIAGNOSIS — Z299 Encounter for prophylactic measures, unspecified: Secondary | ICD-10-CM | POA: Diagnosis not present

## 2020-02-14 DIAGNOSIS — G309 Alzheimer's disease, unspecified: Secondary | ICD-10-CM | POA: Diagnosis not present

## 2020-02-14 DIAGNOSIS — F0280 Dementia in other diseases classified elsewhere without behavioral disturbance: Secondary | ICD-10-CM | POA: Diagnosis not present

## 2020-02-14 DIAGNOSIS — I10 Essential (primary) hypertension: Secondary | ICD-10-CM | POA: Diagnosis not present

## 2020-02-20 DIAGNOSIS — Z0000 Encounter for general adult medical examination without abnormal findings: Secondary | ICD-10-CM | POA: Diagnosis not present

## 2020-02-20 DIAGNOSIS — Z7189 Other specified counseling: Secondary | ICD-10-CM | POA: Diagnosis not present

## 2020-02-20 DIAGNOSIS — Z1331 Encounter for screening for depression: Secondary | ICD-10-CM | POA: Diagnosis not present

## 2020-02-20 DIAGNOSIS — Z6824 Body mass index (BMI) 24.0-24.9, adult: Secondary | ICD-10-CM | POA: Diagnosis not present

## 2020-02-20 DIAGNOSIS — Z1339 Encounter for screening examination for other mental health and behavioral disorders: Secondary | ICD-10-CM | POA: Diagnosis not present

## 2020-02-20 DIAGNOSIS — Z299 Encounter for prophylactic measures, unspecified: Secondary | ICD-10-CM | POA: Diagnosis not present

## 2020-03-04 DIAGNOSIS — N390 Urinary tract infection, site not specified: Secondary | ICD-10-CM | POA: Diagnosis not present

## 2020-03-04 DIAGNOSIS — I4891 Unspecified atrial fibrillation: Secondary | ICD-10-CM | POA: Diagnosis not present

## 2020-03-04 DIAGNOSIS — Z20822 Contact with and (suspected) exposure to covid-19: Secondary | ICD-10-CM | POA: Diagnosis not present

## 2020-03-04 DIAGNOSIS — K922 Gastrointestinal hemorrhage, unspecified: Secondary | ICD-10-CM | POA: Diagnosis not present

## 2020-03-04 DIAGNOSIS — D72829 Elevated white blood cell count, unspecified: Secondary | ICD-10-CM | POA: Diagnosis not present

## 2020-03-04 DIAGNOSIS — R0902 Hypoxemia: Secondary | ICD-10-CM | POA: Diagnosis not present

## 2020-03-04 DIAGNOSIS — R404 Transient alteration of awareness: Secondary | ICD-10-CM | POA: Diagnosis not present

## 2020-03-04 DIAGNOSIS — E876 Hypokalemia: Secondary | ICD-10-CM | POA: Diagnosis not present

## 2020-03-04 DIAGNOSIS — R58 Hemorrhage, not elsewhere classified: Secondary | ICD-10-CM | POA: Diagnosis not present

## 2020-03-06 DIAGNOSIS — F0390 Unspecified dementia without behavioral disturbance: Secondary | ICD-10-CM | POA: Diagnosis not present

## 2020-03-06 DIAGNOSIS — Z20822 Contact with and (suspected) exposure to covid-19: Secondary | ICD-10-CM | POA: Diagnosis not present

## 2020-03-06 DIAGNOSIS — D62 Acute posthemorrhagic anemia: Secondary | ICD-10-CM | POA: Diagnosis not present

## 2020-03-06 DIAGNOSIS — K219 Gastro-esophageal reflux disease without esophagitis: Secondary | ICD-10-CM | POA: Diagnosis not present

## 2020-03-06 DIAGNOSIS — E119 Type 2 diabetes mellitus without complications: Secondary | ICD-10-CM | POA: Diagnosis not present

## 2020-03-06 DIAGNOSIS — Z515 Encounter for palliative care: Secondary | ICD-10-CM | POA: Diagnosis not present

## 2020-03-06 DIAGNOSIS — N390 Urinary tract infection, site not specified: Secondary | ICD-10-CM | POA: Diagnosis not present

## 2020-03-06 DIAGNOSIS — K449 Diaphragmatic hernia without obstruction or gangrene: Secondary | ICD-10-CM | POA: Diagnosis not present

## 2020-03-06 DIAGNOSIS — R4182 Altered mental status, unspecified: Secondary | ICD-10-CM | POA: Diagnosis not present

## 2020-03-06 DIAGNOSIS — E440 Moderate protein-calorie malnutrition: Secondary | ICD-10-CM | POA: Diagnosis not present

## 2020-03-06 DIAGNOSIS — E876 Hypokalemia: Secondary | ICD-10-CM | POA: Diagnosis not present

## 2020-03-06 DIAGNOSIS — F0280 Dementia in other diseases classified elsewhere without behavioral disturbance: Secondary | ICD-10-CM | POA: Diagnosis not present

## 2020-03-06 DIAGNOSIS — F0281 Dementia in other diseases classified elsewhere with behavioral disturbance: Secondary | ICD-10-CM | POA: Diagnosis not present

## 2020-03-06 DIAGNOSIS — K921 Melena: Secondary | ICD-10-CM | POA: Diagnosis not present

## 2020-03-06 DIAGNOSIS — G309 Alzheimer's disease, unspecified: Secondary | ICD-10-CM | POA: Diagnosis not present

## 2020-03-06 DIAGNOSIS — K2950 Unspecified chronic gastritis without bleeding: Secondary | ICD-10-CM | POA: Diagnosis not present

## 2020-03-06 DIAGNOSIS — D500 Iron deficiency anemia secondary to blood loss (chronic): Secondary | ICD-10-CM | POA: Diagnosis not present

## 2020-03-06 DIAGNOSIS — Z681 Body mass index (BMI) 19 or less, adult: Secondary | ICD-10-CM | POA: Diagnosis not present

## 2020-03-06 DIAGNOSIS — K922 Gastrointestinal hemorrhage, unspecified: Secondary | ICD-10-CM | POA: Diagnosis not present

## 2020-03-06 DIAGNOSIS — E7800 Pure hypercholesterolemia, unspecified: Secondary | ICD-10-CM | POA: Diagnosis not present

## 2020-03-06 DIAGNOSIS — Z87891 Personal history of nicotine dependence: Secondary | ICD-10-CM | POA: Diagnosis not present

## 2020-03-06 DIAGNOSIS — I10 Essential (primary) hypertension: Secondary | ICD-10-CM | POA: Diagnosis not present

## 2020-03-06 DIAGNOSIS — Z66 Do not resuscitate: Secondary | ICD-10-CM | POA: Diagnosis not present

## 2020-03-06 DIAGNOSIS — E870 Hyperosmolality and hypernatremia: Secondary | ICD-10-CM | POA: Diagnosis not present

## 2020-03-09 DIAGNOSIS — G309 Alzheimer's disease, unspecified: Secondary | ICD-10-CM | POA: Diagnosis not present

## 2020-03-09 DIAGNOSIS — E870 Hyperosmolality and hypernatremia: Secondary | ICD-10-CM | POA: Diagnosis not present

## 2020-03-09 DIAGNOSIS — E876 Hypokalemia: Secondary | ICD-10-CM | POA: Diagnosis not present

## 2020-03-09 DIAGNOSIS — K922 Gastrointestinal hemorrhage, unspecified: Secondary | ICD-10-CM | POA: Diagnosis not present

## 2020-03-09 DIAGNOSIS — R4182 Altered mental status, unspecified: Secondary | ICD-10-CM | POA: Diagnosis not present

## 2020-03-09 DIAGNOSIS — I10 Essential (primary) hypertension: Secondary | ICD-10-CM | POA: Diagnosis not present

## 2020-03-10 DIAGNOSIS — E870 Hyperosmolality and hypernatremia: Secondary | ICD-10-CM | POA: Diagnosis not present

## 2020-03-10 DIAGNOSIS — R4182 Altered mental status, unspecified: Secondary | ICD-10-CM | POA: Diagnosis not present

## 2020-03-10 DIAGNOSIS — N390 Urinary tract infection, site not specified: Secondary | ICD-10-CM | POA: Diagnosis not present

## 2020-03-10 DIAGNOSIS — I10 Essential (primary) hypertension: Secondary | ICD-10-CM | POA: Diagnosis not present

## 2020-03-10 DIAGNOSIS — K922 Gastrointestinal hemorrhage, unspecified: Secondary | ICD-10-CM | POA: Diagnosis not present

## 2020-03-10 DIAGNOSIS — F0390 Unspecified dementia without behavioral disturbance: Secondary | ICD-10-CM | POA: Diagnosis not present

## 2020-03-10 DIAGNOSIS — E876 Hypokalemia: Secondary | ICD-10-CM | POA: Diagnosis not present

## 2020-03-11 DIAGNOSIS — N390 Urinary tract infection, site not specified: Secondary | ICD-10-CM | POA: Diagnosis not present

## 2020-03-11 DIAGNOSIS — K2950 Unspecified chronic gastritis without bleeding: Secondary | ICD-10-CM | POA: Diagnosis not present

## 2020-03-11 DIAGNOSIS — K921 Melena: Secondary | ICD-10-CM | POA: Diagnosis not present

## 2020-03-11 DIAGNOSIS — K449 Diaphragmatic hernia without obstruction or gangrene: Secondary | ICD-10-CM | POA: Diagnosis not present

## 2020-03-11 DIAGNOSIS — K922 Gastrointestinal hemorrhage, unspecified: Secondary | ICD-10-CM | POA: Diagnosis not present

## 2020-03-11 DIAGNOSIS — E876 Hypokalemia: Secondary | ICD-10-CM | POA: Diagnosis not present

## 2020-03-11 DIAGNOSIS — G309 Alzheimer's disease, unspecified: Secondary | ICD-10-CM | POA: Diagnosis not present

## 2020-03-12 DIAGNOSIS — G309 Alzheimer's disease, unspecified: Secondary | ICD-10-CM | POA: Diagnosis not present

## 2020-03-12 DIAGNOSIS — K922 Gastrointestinal hemorrhage, unspecified: Secondary | ICD-10-CM | POA: Diagnosis not present

## 2020-03-12 DIAGNOSIS — E876 Hypokalemia: Secondary | ICD-10-CM | POA: Diagnosis not present

## 2020-03-12 DIAGNOSIS — N390 Urinary tract infection, site not specified: Secondary | ICD-10-CM | POA: Diagnosis not present

## 2020-03-13 DIAGNOSIS — E876 Hypokalemia: Secondary | ICD-10-CM | POA: Diagnosis not present

## 2020-03-13 DIAGNOSIS — K922 Gastrointestinal hemorrhage, unspecified: Secondary | ICD-10-CM | POA: Diagnosis not present

## 2020-03-13 DIAGNOSIS — N390 Urinary tract infection, site not specified: Secondary | ICD-10-CM | POA: Diagnosis not present

## 2020-03-13 DIAGNOSIS — G309 Alzheimer's disease, unspecified: Secondary | ICD-10-CM | POA: Diagnosis not present

## 2020-03-13 DIAGNOSIS — K921 Melena: Secondary | ICD-10-CM | POA: Diagnosis not present

## 2020-03-14 DIAGNOSIS — K922 Gastrointestinal hemorrhage, unspecified: Secondary | ICD-10-CM | POA: Diagnosis not present

## 2020-03-14 DIAGNOSIS — N390 Urinary tract infection, site not specified: Secondary | ICD-10-CM | POA: Diagnosis not present

## 2020-03-14 DIAGNOSIS — E876 Hypokalemia: Secondary | ICD-10-CM | POA: Diagnosis not present

## 2020-03-14 DIAGNOSIS — G309 Alzheimer's disease, unspecified: Secondary | ICD-10-CM | POA: Diagnosis not present

## 2020-03-15 DIAGNOSIS — E870 Hyperosmolality and hypernatremia: Secondary | ICD-10-CM | POA: Diagnosis not present

## 2020-03-15 DIAGNOSIS — I10 Essential (primary) hypertension: Secondary | ICD-10-CM | POA: Diagnosis not present

## 2020-03-15 DIAGNOSIS — K922 Gastrointestinal hemorrhage, unspecified: Secondary | ICD-10-CM | POA: Diagnosis not present

## 2020-03-15 DIAGNOSIS — D500 Iron deficiency anemia secondary to blood loss (chronic): Secondary | ICD-10-CM | POA: Diagnosis not present

## 2020-03-15 DIAGNOSIS — E876 Hypokalemia: Secondary | ICD-10-CM | POA: Diagnosis not present

## 2020-03-16 DIAGNOSIS — G309 Alzheimer's disease, unspecified: Secondary | ICD-10-CM | POA: Diagnosis not present

## 2020-03-16 DIAGNOSIS — E876 Hypokalemia: Secondary | ICD-10-CM | POA: Diagnosis not present

## 2020-03-16 DIAGNOSIS — D62 Acute posthemorrhagic anemia: Secondary | ICD-10-CM | POA: Diagnosis not present

## 2020-03-16 DIAGNOSIS — E870 Hyperosmolality and hypernatremia: Secondary | ICD-10-CM | POA: Diagnosis not present

## 2020-03-16 DIAGNOSIS — F0281 Dementia in other diseases classified elsewhere with behavioral disturbance: Secondary | ICD-10-CM | POA: Diagnosis not present

## 2020-03-16 DIAGNOSIS — K922 Gastrointestinal hemorrhage, unspecified: Secondary | ICD-10-CM | POA: Diagnosis not present

## 2020-03-17 DIAGNOSIS — K922 Gastrointestinal hemorrhage, unspecified: Secondary | ICD-10-CM | POA: Diagnosis not present

## 2020-03-17 DIAGNOSIS — E876 Hypokalemia: Secondary | ICD-10-CM | POA: Diagnosis not present

## 2020-03-17 DIAGNOSIS — G309 Alzheimer's disease, unspecified: Secondary | ICD-10-CM | POA: Diagnosis not present

## 2020-03-17 DIAGNOSIS — N390 Urinary tract infection, site not specified: Secondary | ICD-10-CM | POA: Diagnosis not present

## 2020-03-25 DEATH — deceased
